# Patient Record
Sex: Female | Born: 1935 | Race: White | Hispanic: No | Smoking: Never smoker
Health system: Southern US, Community
[De-identification: ages and names within clinical notes are randomized; demographics above are authoritative.]

## PROBLEM LIST (undated history)

## (undated) DIAGNOSIS — I10 Essential (primary) hypertension: Secondary | ICD-10-CM

## (undated) HISTORY — DX: Essential (primary) hypertension: I10

## (undated) HISTORY — PX: OTHER SURGICAL HISTORY: SHX169

---

## 2015-02-16 DIAGNOSIS — S72002D Fracture of unspecified part of neck of left femur, subsequent encounter for closed fracture with routine healing: Secondary | ICD-10-CM | POA: Diagnosis not present

## 2015-02-16 DIAGNOSIS — M1990 Unspecified osteoarthritis, unspecified site: Secondary | ICD-10-CM | POA: Diagnosis not present

## 2015-02-16 DIAGNOSIS — S72092A Other fracture of head and neck of left femur, initial encounter for closed fracture: Secondary | ICD-10-CM | POA: Diagnosis not present

## 2015-02-16 DIAGNOSIS — K219 Gastro-esophageal reflux disease without esophagitis: Secondary | ICD-10-CM | POA: Diagnosis not present

## 2015-02-16 DIAGNOSIS — E039 Hypothyroidism, unspecified: Secondary | ICD-10-CM | POA: Diagnosis not present

## 2015-02-16 DIAGNOSIS — S79912A Unspecified injury of left hip, initial encounter: Secondary | ICD-10-CM | POA: Diagnosis not present

## 2015-02-16 DIAGNOSIS — Z96642 Presence of left artificial hip joint: Secondary | ICD-10-CM | POA: Diagnosis not present

## 2015-02-16 DIAGNOSIS — D649 Anemia, unspecified: Secondary | ICD-10-CM | POA: Diagnosis not present

## 2015-02-16 DIAGNOSIS — F419 Anxiety disorder, unspecified: Secondary | ICD-10-CM | POA: Diagnosis not present

## 2015-02-16 DIAGNOSIS — Z22321 Carrier or suspected carrier of Methicillin susceptible Staphylococcus aureus: Secondary | ICD-10-CM | POA: Diagnosis not present

## 2015-02-16 DIAGNOSIS — I38 Endocarditis, valve unspecified: Secondary | ICD-10-CM | POA: Diagnosis not present

## 2015-02-16 DIAGNOSIS — S72012A Unspecified intracapsular fracture of left femur, initial encounter for closed fracture: Secondary | ICD-10-CM | POA: Diagnosis not present

## 2015-02-16 DIAGNOSIS — I2510 Atherosclerotic heart disease of native coronary artery without angina pectoris: Secondary | ICD-10-CM | POA: Diagnosis not present

## 2015-02-16 DIAGNOSIS — R52 Pain, unspecified: Secondary | ICD-10-CM | POA: Diagnosis not present

## 2015-02-16 DIAGNOSIS — M80052A Age-related osteoporosis with current pathological fracture, left femur, initial encounter for fracture: Secondary | ICD-10-CM | POA: Diagnosis not present

## 2015-02-16 DIAGNOSIS — S299XXA Unspecified injury of thorax, initial encounter: Secondary | ICD-10-CM | POA: Diagnosis not present

## 2015-02-16 DIAGNOSIS — S72012D Unspecified intracapsular fracture of left femur, subsequent encounter for closed fracture with routine healing: Secondary | ICD-10-CM | POA: Diagnosis not present

## 2015-02-16 DIAGNOSIS — Z87442 Personal history of urinary calculi: Secondary | ICD-10-CM | POA: Diagnosis not present

## 2015-02-16 DIAGNOSIS — W010XXA Fall on same level from slipping, tripping and stumbling without subsequent striking against object, initial encounter: Secondary | ICD-10-CM | POA: Diagnosis not present

## 2015-02-16 DIAGNOSIS — M25559 Pain in unspecified hip: Secondary | ICD-10-CM | POA: Diagnosis not present

## 2015-02-16 DIAGNOSIS — R230 Cyanosis: Secondary | ICD-10-CM | POA: Diagnosis not present

## 2015-02-16 DIAGNOSIS — F329 Major depressive disorder, single episode, unspecified: Secondary | ICD-10-CM | POA: Diagnosis not present

## 2015-02-16 DIAGNOSIS — M1612 Unilateral primary osteoarthritis, left hip: Secondary | ICD-10-CM | POA: Diagnosis not present

## 2015-02-16 DIAGNOSIS — I952 Hypotension due to drugs: Secondary | ICD-10-CM | POA: Diagnosis not present

## 2015-02-16 DIAGNOSIS — Z471 Aftercare following joint replacement surgery: Secondary | ICD-10-CM | POA: Diagnosis not present

## 2015-02-16 DIAGNOSIS — Z955 Presence of coronary angioplasty implant and graft: Secondary | ICD-10-CM | POA: Diagnosis not present

## 2015-02-16 DIAGNOSIS — J45909 Unspecified asthma, uncomplicated: Secondary | ICD-10-CM | POA: Diagnosis not present

## 2015-02-16 DIAGNOSIS — M810 Age-related osteoporosis without current pathological fracture: Secondary | ICD-10-CM | POA: Diagnosis not present

## 2015-02-16 DIAGNOSIS — R350 Frequency of micturition: Secondary | ICD-10-CM | POA: Diagnosis not present

## 2015-02-16 DIAGNOSIS — Z7982 Long term (current) use of aspirin: Secondary | ICD-10-CM | POA: Diagnosis not present

## 2015-02-16 DIAGNOSIS — S79922A Unspecified injury of left thigh, initial encounter: Secondary | ICD-10-CM | POA: Diagnosis not present

## 2015-02-16 DIAGNOSIS — I10 Essential (primary) hypertension: Secondary | ICD-10-CM | POA: Diagnosis not present

## 2015-02-16 DIAGNOSIS — R600 Localized edema: Secondary | ICD-10-CM | POA: Diagnosis not present

## 2015-02-16 DIAGNOSIS — S72142A Displaced intertrochanteric fracture of left femur, initial encounter for closed fracture: Secondary | ICD-10-CM | POA: Diagnosis not present

## 2015-02-16 DIAGNOSIS — Z8679 Personal history of other diseases of the circulatory system: Secondary | ICD-10-CM | POA: Diagnosis not present

## 2015-02-16 DIAGNOSIS — Z882 Allergy status to sulfonamides status: Secondary | ICD-10-CM | POA: Diagnosis not present

## 2015-02-16 DIAGNOSIS — S72002A Fracture of unspecified part of neck of left femur, initial encounter for closed fracture: Secondary | ICD-10-CM | POA: Diagnosis not present

## 2015-02-16 DIAGNOSIS — Z79899 Other long term (current) drug therapy: Secondary | ICD-10-CM | POA: Diagnosis not present

## 2015-02-16 DIAGNOSIS — M2170 Unequal limb length (acquired), unspecified site: Secondary | ICD-10-CM | POA: Diagnosis not present

## 2015-02-16 DIAGNOSIS — I340 Nonrheumatic mitral (valve) insufficiency: Secondary | ICD-10-CM | POA: Diagnosis not present

## 2015-02-16 DIAGNOSIS — M25552 Pain in left hip: Secondary | ICD-10-CM | POA: Diagnosis not present

## 2015-02-16 DIAGNOSIS — I252 Old myocardial infarction: Secondary | ICD-10-CM | POA: Diagnosis not present

## 2015-02-16 DIAGNOSIS — S72042A Displaced fracture of base of neck of left femur, initial encounter for closed fracture: Secondary | ICD-10-CM | POA: Diagnosis not present

## 2015-02-16 DIAGNOSIS — Z7401 Bed confinement status: Secondary | ICD-10-CM | POA: Diagnosis not present

## 2015-02-16 DIAGNOSIS — I9581 Postprocedural hypotension: Secondary | ICD-10-CM | POA: Diagnosis not present

## 2015-02-21 DIAGNOSIS — I952 Hypotension due to drugs: Secondary | ICD-10-CM | POA: Diagnosis not present

## 2015-02-21 DIAGNOSIS — S72002A Fracture of unspecified part of neck of left femur, initial encounter for closed fracture: Secondary | ICD-10-CM | POA: Diagnosis not present

## 2015-02-21 DIAGNOSIS — D649 Anemia, unspecified: Secondary | ICD-10-CM | POA: Diagnosis not present

## 2015-02-21 DIAGNOSIS — I2510 Atherosclerotic heart disease of native coronary artery without angina pectoris: Secondary | ICD-10-CM | POA: Diagnosis not present

## 2015-02-21 DIAGNOSIS — S72002D Fracture of unspecified part of neck of left femur, subsequent encounter for closed fracture with routine healing: Secondary | ICD-10-CM | POA: Diagnosis not present

## 2015-02-21 DIAGNOSIS — M810 Age-related osteoporosis without current pathological fracture: Secondary | ICD-10-CM | POA: Diagnosis not present

## 2015-02-21 DIAGNOSIS — E039 Hypothyroidism, unspecified: Secondary | ICD-10-CM | POA: Diagnosis not present

## 2015-02-21 DIAGNOSIS — G8918 Other acute postprocedural pain: Secondary | ICD-10-CM | POA: Diagnosis not present

## 2015-02-21 DIAGNOSIS — Z4789 Encounter for other orthopedic aftercare: Secondary | ICD-10-CM | POA: Diagnosis not present

## 2015-02-21 DIAGNOSIS — S72042A Displaced fracture of base of neck of left femur, initial encounter for closed fracture: Secondary | ICD-10-CM | POA: Diagnosis not present

## 2015-02-21 DIAGNOSIS — Z8679 Personal history of other diseases of the circulatory system: Secondary | ICD-10-CM | POA: Diagnosis not present

## 2015-02-21 DIAGNOSIS — M25559 Pain in unspecified hip: Secondary | ICD-10-CM | POA: Diagnosis not present

## 2015-02-21 DIAGNOSIS — M25552 Pain in left hip: Secondary | ICD-10-CM | POA: Diagnosis not present

## 2015-02-21 DIAGNOSIS — Z7401 Bed confinement status: Secondary | ICD-10-CM | POA: Diagnosis not present

## 2015-02-22 DIAGNOSIS — Z4789 Encounter for other orthopedic aftercare: Secondary | ICD-10-CM | POA: Diagnosis not present

## 2015-02-22 DIAGNOSIS — G8918 Other acute postprocedural pain: Secondary | ICD-10-CM | POA: Diagnosis not present

## 2015-02-22 DIAGNOSIS — M810 Age-related osteoporosis without current pathological fracture: Secondary | ICD-10-CM | POA: Diagnosis not present

## 2015-02-22 DIAGNOSIS — D649 Anemia, unspecified: Secondary | ICD-10-CM | POA: Diagnosis not present

## 2015-02-27 DIAGNOSIS — S72042A Displaced fracture of base of neck of left femur, initial encounter for closed fracture: Secondary | ICD-10-CM | POA: Diagnosis not present

## 2015-03-08 DIAGNOSIS — Z471 Aftercare following joint replacement surgery: Secondary | ICD-10-CM | POA: Diagnosis not present

## 2015-03-08 DIAGNOSIS — I10 Essential (primary) hypertension: Secondary | ICD-10-CM | POA: Diagnosis not present

## 2015-03-08 DIAGNOSIS — Z96642 Presence of left artificial hip joint: Secondary | ICD-10-CM | POA: Diagnosis not present

## 2015-03-10 DIAGNOSIS — I10 Essential (primary) hypertension: Secondary | ICD-10-CM | POA: Diagnosis not present

## 2015-03-10 DIAGNOSIS — Z471 Aftercare following joint replacement surgery: Secondary | ICD-10-CM | POA: Diagnosis not present

## 2015-03-10 DIAGNOSIS — Z96642 Presence of left artificial hip joint: Secondary | ICD-10-CM | POA: Diagnosis not present

## 2015-03-11 DIAGNOSIS — Z471 Aftercare following joint replacement surgery: Secondary | ICD-10-CM | POA: Diagnosis not present

## 2015-03-11 DIAGNOSIS — Z96642 Presence of left artificial hip joint: Secondary | ICD-10-CM | POA: Diagnosis not present

## 2015-03-11 DIAGNOSIS — I10 Essential (primary) hypertension: Secondary | ICD-10-CM | POA: Diagnosis not present

## 2015-03-12 DIAGNOSIS — Z96642 Presence of left artificial hip joint: Secondary | ICD-10-CM | POA: Diagnosis not present

## 2015-03-12 DIAGNOSIS — I10 Essential (primary) hypertension: Secondary | ICD-10-CM | POA: Diagnosis not present

## 2015-03-12 DIAGNOSIS — Z471 Aftercare following joint replacement surgery: Secondary | ICD-10-CM | POA: Diagnosis not present

## 2015-03-14 DIAGNOSIS — I10 Essential (primary) hypertension: Secondary | ICD-10-CM | POA: Diagnosis not present

## 2015-03-14 DIAGNOSIS — Z471 Aftercare following joint replacement surgery: Secondary | ICD-10-CM | POA: Diagnosis not present

## 2015-03-14 DIAGNOSIS — Z96642 Presence of left artificial hip joint: Secondary | ICD-10-CM | POA: Diagnosis not present

## 2015-03-18 DIAGNOSIS — Z471 Aftercare following joint replacement surgery: Secondary | ICD-10-CM | POA: Diagnosis not present

## 2015-03-18 DIAGNOSIS — I10 Essential (primary) hypertension: Secondary | ICD-10-CM | POA: Diagnosis not present

## 2015-03-18 DIAGNOSIS — Z96642 Presence of left artificial hip joint: Secondary | ICD-10-CM | POA: Diagnosis not present

## 2015-03-20 DIAGNOSIS — Z471 Aftercare following joint replacement surgery: Secondary | ICD-10-CM | POA: Diagnosis not present

## 2015-03-20 DIAGNOSIS — Z96642 Presence of left artificial hip joint: Secondary | ICD-10-CM | POA: Diagnosis not present

## 2015-03-20 DIAGNOSIS — I10 Essential (primary) hypertension: Secondary | ICD-10-CM | POA: Diagnosis not present

## 2015-03-21 DIAGNOSIS — I10 Essential (primary) hypertension: Secondary | ICD-10-CM | POA: Diagnosis not present

## 2015-03-21 DIAGNOSIS — Z96642 Presence of left artificial hip joint: Secondary | ICD-10-CM | POA: Diagnosis not present

## 2015-03-21 DIAGNOSIS — Z471 Aftercare following joint replacement surgery: Secondary | ICD-10-CM | POA: Diagnosis not present

## 2015-03-25 DIAGNOSIS — I10 Essential (primary) hypertension: Secondary | ICD-10-CM | POA: Diagnosis not present

## 2015-03-25 DIAGNOSIS — Z96642 Presence of left artificial hip joint: Secondary | ICD-10-CM | POA: Diagnosis not present

## 2015-03-25 DIAGNOSIS — Z471 Aftercare following joint replacement surgery: Secondary | ICD-10-CM | POA: Diagnosis not present

## 2015-03-27 DIAGNOSIS — E785 Hyperlipidemia, unspecified: Secondary | ICD-10-CM | POA: Diagnosis not present

## 2015-03-27 DIAGNOSIS — E039 Hypothyroidism, unspecified: Secondary | ICD-10-CM | POA: Diagnosis not present

## 2015-03-27 DIAGNOSIS — I2510 Atherosclerotic heart disease of native coronary artery without angina pectoris: Secondary | ICD-10-CM | POA: Diagnosis not present

## 2015-03-27 DIAGNOSIS — I10 Essential (primary) hypertension: Secondary | ICD-10-CM | POA: Diagnosis not present

## 2015-03-28 DIAGNOSIS — I10 Essential (primary) hypertension: Secondary | ICD-10-CM | POA: Diagnosis not present

## 2015-03-28 DIAGNOSIS — Z96642 Presence of left artificial hip joint: Secondary | ICD-10-CM | POA: Diagnosis not present

## 2015-03-28 DIAGNOSIS — Z471 Aftercare following joint replacement surgery: Secondary | ICD-10-CM | POA: Diagnosis not present

## 2015-04-01 DIAGNOSIS — Z96642 Presence of left artificial hip joint: Secondary | ICD-10-CM | POA: Diagnosis not present

## 2015-04-01 DIAGNOSIS — Z471 Aftercare following joint replacement surgery: Secondary | ICD-10-CM | POA: Diagnosis not present

## 2015-04-01 DIAGNOSIS — I10 Essential (primary) hypertension: Secondary | ICD-10-CM | POA: Diagnosis not present

## 2015-04-04 DIAGNOSIS — Z96642 Presence of left artificial hip joint: Secondary | ICD-10-CM | POA: Diagnosis not present

## 2015-04-04 DIAGNOSIS — I10 Essential (primary) hypertension: Secondary | ICD-10-CM | POA: Diagnosis not present

## 2015-04-04 DIAGNOSIS — Z471 Aftercare following joint replacement surgery: Secondary | ICD-10-CM | POA: Diagnosis not present

## 2015-04-11 DIAGNOSIS — Z96649 Presence of unspecified artificial hip joint: Secondary | ICD-10-CM | POA: Diagnosis not present

## 2015-05-27 DIAGNOSIS — Z96649 Presence of unspecified artificial hip joint: Secondary | ICD-10-CM | POA: Diagnosis not present

## 2015-06-11 DIAGNOSIS — E039 Hypothyroidism, unspecified: Secondary | ICD-10-CM | POA: Diagnosis not present

## 2015-06-11 DIAGNOSIS — Z8781 Personal history of (healed) traumatic fracture: Secondary | ICD-10-CM | POA: Diagnosis not present

## 2015-06-11 DIAGNOSIS — I10 Essential (primary) hypertension: Secondary | ICD-10-CM | POA: Diagnosis not present

## 2015-06-11 DIAGNOSIS — Z79899 Other long term (current) drug therapy: Secondary | ICD-10-CM | POA: Diagnosis not present

## 2015-06-11 DIAGNOSIS — E785 Hyperlipidemia, unspecified: Secondary | ICD-10-CM | POA: Diagnosis not present

## 2015-06-11 DIAGNOSIS — I2510 Atherosclerotic heart disease of native coronary artery without angina pectoris: Secondary | ICD-10-CM | POA: Diagnosis not present

## 2015-08-12 DIAGNOSIS — M810 Age-related osteoporosis without current pathological fracture: Secondary | ICD-10-CM | POA: Diagnosis not present

## 2015-08-12 DIAGNOSIS — Z8781 Personal history of (healed) traumatic fracture: Secondary | ICD-10-CM | POA: Diagnosis not present

## 2015-08-19 DIAGNOSIS — E039 Hypothyroidism, unspecified: Secondary | ICD-10-CM | POA: Diagnosis not present

## 2015-08-28 DIAGNOSIS — Z96649 Presence of unspecified artificial hip joint: Secondary | ICD-10-CM | POA: Diagnosis not present

## 2015-09-02 DIAGNOSIS — M25552 Pain in left hip: Secondary | ICD-10-CM | POA: Diagnosis not present

## 2015-09-02 DIAGNOSIS — Z96649 Presence of unspecified artificial hip joint: Secondary | ICD-10-CM | POA: Diagnosis not present

## 2015-10-10 DIAGNOSIS — Z79899 Other long term (current) drug therapy: Secondary | ICD-10-CM | POA: Diagnosis not present

## 2015-10-10 DIAGNOSIS — Z0000 Encounter for general adult medical examination without abnormal findings: Secondary | ICD-10-CM | POA: Diagnosis not present

## 2015-10-10 DIAGNOSIS — E785 Hyperlipidemia, unspecified: Secondary | ICD-10-CM | POA: Diagnosis not present

## 2015-10-10 DIAGNOSIS — E039 Hypothyroidism, unspecified: Secondary | ICD-10-CM | POA: Diagnosis not present

## 2015-10-10 DIAGNOSIS — I2510 Atherosclerotic heart disease of native coronary artery without angina pectoris: Secondary | ICD-10-CM | POA: Diagnosis not present

## 2015-10-10 DIAGNOSIS — Z1389 Encounter for screening for other disorder: Secondary | ICD-10-CM | POA: Diagnosis not present

## 2015-10-10 DIAGNOSIS — I10 Essential (primary) hypertension: Secondary | ICD-10-CM | POA: Diagnosis not present

## 2016-02-10 DIAGNOSIS — I10 Essential (primary) hypertension: Secondary | ICD-10-CM | POA: Diagnosis not present

## 2016-02-10 DIAGNOSIS — E039 Hypothyroidism, unspecified: Secondary | ICD-10-CM | POA: Diagnosis not present

## 2016-02-10 DIAGNOSIS — Z79899 Other long term (current) drug therapy: Secondary | ICD-10-CM | POA: Diagnosis not present

## 2016-02-10 DIAGNOSIS — I2510 Atherosclerotic heart disease of native coronary artery without angina pectoris: Secondary | ICD-10-CM | POA: Diagnosis not present

## 2016-02-10 DIAGNOSIS — J40 Bronchitis, not specified as acute or chronic: Secondary | ICD-10-CM | POA: Diagnosis not present

## 2016-02-10 DIAGNOSIS — R7301 Impaired fasting glucose: Secondary | ICD-10-CM | POA: Diagnosis not present

## 2016-08-11 DIAGNOSIS — E039 Hypothyroidism, unspecified: Secondary | ICD-10-CM | POA: Diagnosis not present

## 2016-08-11 DIAGNOSIS — R7301 Impaired fasting glucose: Secondary | ICD-10-CM | POA: Diagnosis not present

## 2016-08-11 DIAGNOSIS — M810 Age-related osteoporosis without current pathological fracture: Secondary | ICD-10-CM | POA: Diagnosis not present

## 2016-08-11 DIAGNOSIS — Z79899 Other long term (current) drug therapy: Secondary | ICD-10-CM | POA: Diagnosis not present

## 2016-08-11 DIAGNOSIS — M542 Cervicalgia: Secondary | ICD-10-CM | POA: Diagnosis not present

## 2016-08-11 DIAGNOSIS — E785 Hyperlipidemia, unspecified: Secondary | ICD-10-CM | POA: Diagnosis not present

## 2016-08-11 DIAGNOSIS — M50321 Other cervical disc degeneration at C4-C5 level: Secondary | ICD-10-CM | POA: Diagnosis not present

## 2016-08-11 DIAGNOSIS — I2510 Atherosclerotic heart disease of native coronary artery without angina pectoris: Secondary | ICD-10-CM | POA: Diagnosis not present

## 2016-08-11 DIAGNOSIS — I10 Essential (primary) hypertension: Secondary | ICD-10-CM | POA: Diagnosis not present

## 2016-09-27 DIAGNOSIS — R221 Localized swelling, mass and lump, neck: Secondary | ICD-10-CM | POA: Diagnosis not present

## 2016-09-27 DIAGNOSIS — M542 Cervicalgia: Secondary | ICD-10-CM | POA: Diagnosis not present

## 2016-09-29 DIAGNOSIS — M542 Cervicalgia: Secondary | ICD-10-CM | POA: Diagnosis not present

## 2016-10-14 DIAGNOSIS — M542 Cervicalgia: Secondary | ICD-10-CM | POA: Diagnosis not present

## 2017-02-04 DIAGNOSIS — I2510 Atherosclerotic heart disease of native coronary artery without angina pectoris: Secondary | ICD-10-CM | POA: Diagnosis not present

## 2017-02-04 DIAGNOSIS — Z79899 Other long term (current) drug therapy: Secondary | ICD-10-CM | POA: Diagnosis not present

## 2017-02-04 DIAGNOSIS — E039 Hypothyroidism, unspecified: Secondary | ICD-10-CM | POA: Diagnosis not present

## 2017-02-04 DIAGNOSIS — R7301 Impaired fasting glucose: Secondary | ICD-10-CM | POA: Diagnosis not present

## 2017-02-04 DIAGNOSIS — I10 Essential (primary) hypertension: Secondary | ICD-10-CM | POA: Diagnosis not present

## 2017-02-04 DIAGNOSIS — E559 Vitamin D deficiency, unspecified: Secondary | ICD-10-CM | POA: Diagnosis not present

## 2017-06-10 DIAGNOSIS — I10 Essential (primary) hypertension: Secondary | ICD-10-CM | POA: Diagnosis not present

## 2017-06-10 DIAGNOSIS — Z79899 Other long term (current) drug therapy: Secondary | ICD-10-CM | POA: Diagnosis not present

## 2017-06-10 DIAGNOSIS — E039 Hypothyroidism, unspecified: Secondary | ICD-10-CM | POA: Diagnosis not present

## 2017-06-10 DIAGNOSIS — R7301 Impaired fasting glucose: Secondary | ICD-10-CM | POA: Diagnosis not present

## 2017-07-15 DIAGNOSIS — I10 Essential (primary) hypertension: Secondary | ICD-10-CM | POA: Diagnosis not present

## 2017-08-05 DIAGNOSIS — I10 Essential (primary) hypertension: Secondary | ICD-10-CM | POA: Diagnosis not present

## 2017-08-05 DIAGNOSIS — M7989 Other specified soft tissue disorders: Secondary | ICD-10-CM | POA: Diagnosis not present

## 2017-10-08 DIAGNOSIS — I10 Essential (primary) hypertension: Secondary | ICD-10-CM | POA: Diagnosis not present

## 2018-02-10 DIAGNOSIS — M62838 Other muscle spasm: Secondary | ICD-10-CM | POA: Diagnosis not present

## 2018-02-10 DIAGNOSIS — E785 Hyperlipidemia, unspecified: Secondary | ICD-10-CM | POA: Diagnosis not present

## 2018-02-10 DIAGNOSIS — I2510 Atherosclerotic heart disease of native coronary artery without angina pectoris: Secondary | ICD-10-CM | POA: Diagnosis not present

## 2018-02-10 DIAGNOSIS — I10 Essential (primary) hypertension: Secondary | ICD-10-CM | POA: Diagnosis not present

## 2018-02-10 DIAGNOSIS — E039 Hypothyroidism, unspecified: Secondary | ICD-10-CM | POA: Diagnosis not present

## 2018-02-10 DIAGNOSIS — Z79899 Other long term (current) drug therapy: Secondary | ICD-10-CM | POA: Diagnosis not present

## 2018-07-21 DIAGNOSIS — N390 Urinary tract infection, site not specified: Secondary | ICD-10-CM | POA: Diagnosis not present

## 2018-09-03 DIAGNOSIS — J040 Acute laryngitis: Secondary | ICD-10-CM | POA: Diagnosis not present

## 2018-09-03 DIAGNOSIS — R05 Cough: Secondary | ICD-10-CM | POA: Diagnosis not present

## 2018-09-22 DIAGNOSIS — Z23 Encounter for immunization: Secondary | ICD-10-CM | POA: Diagnosis not present

## 2018-09-22 DIAGNOSIS — Z1339 Encounter for screening examination for other mental health and behavioral disorders: Secondary | ICD-10-CM | POA: Diagnosis not present

## 2018-09-22 DIAGNOSIS — Z7189 Other specified counseling: Secondary | ICD-10-CM | POA: Diagnosis not present

## 2018-09-22 DIAGNOSIS — Z0000 Encounter for general adult medical examination without abnormal findings: Secondary | ICD-10-CM | POA: Diagnosis not present

## 2018-09-22 DIAGNOSIS — Z1331 Encounter for screening for depression: Secondary | ICD-10-CM | POA: Diagnosis not present

## 2018-10-12 DIAGNOSIS — R011 Cardiac murmur, unspecified: Secondary | ICD-10-CM | POA: Diagnosis not present

## 2018-10-12 DIAGNOSIS — I340 Nonrheumatic mitral (valve) insufficiency: Secondary | ICD-10-CM

## 2018-10-12 DIAGNOSIS — I6523 Occlusion and stenosis of bilateral carotid arteries: Secondary | ICD-10-CM | POA: Diagnosis not present

## 2018-10-12 DIAGNOSIS — R0989 Other specified symptoms and signs involving the circulatory and respiratory systems: Secondary | ICD-10-CM | POA: Diagnosis not present

## 2019-03-27 DIAGNOSIS — Z6821 Body mass index (BMI) 21.0-21.9, adult: Secondary | ICD-10-CM | POA: Diagnosis not present

## 2019-03-27 DIAGNOSIS — Z79899 Other long term (current) drug therapy: Secondary | ICD-10-CM | POA: Diagnosis not present

## 2019-03-27 DIAGNOSIS — M810 Age-related osteoporosis without current pathological fracture: Secondary | ICD-10-CM | POA: Diagnosis not present

## 2019-03-27 DIAGNOSIS — I10 Essential (primary) hypertension: Secondary | ICD-10-CM | POA: Diagnosis not present

## 2019-03-27 DIAGNOSIS — E039 Hypothyroidism, unspecified: Secondary | ICD-10-CM | POA: Diagnosis not present

## 2019-03-27 DIAGNOSIS — I2510 Atherosclerotic heart disease of native coronary artery without angina pectoris: Secondary | ICD-10-CM | POA: Diagnosis not present

## 2019-03-27 DIAGNOSIS — E785 Hyperlipidemia, unspecified: Secondary | ICD-10-CM | POA: Diagnosis not present

## 2019-09-28 DIAGNOSIS — E039 Hypothyroidism, unspecified: Secondary | ICD-10-CM | POA: Diagnosis not present

## 2019-09-28 DIAGNOSIS — E785 Hyperlipidemia, unspecified: Secondary | ICD-10-CM | POA: Diagnosis not present

## 2019-09-28 DIAGNOSIS — Z79899 Other long term (current) drug therapy: Secondary | ICD-10-CM | POA: Diagnosis not present

## 2019-09-28 DIAGNOSIS — I7090 Unspecified atherosclerosis: Secondary | ICD-10-CM | POA: Diagnosis not present

## 2019-09-28 DIAGNOSIS — Z6821 Body mass index (BMI) 21.0-21.9, adult: Secondary | ICD-10-CM | POA: Diagnosis not present

## 2019-09-28 DIAGNOSIS — I2510 Atherosclerotic heart disease of native coronary artery without angina pectoris: Secondary | ICD-10-CM | POA: Diagnosis not present

## 2019-09-28 DIAGNOSIS — I10 Essential (primary) hypertension: Secondary | ICD-10-CM | POA: Diagnosis not present

## 2019-11-20 DIAGNOSIS — E039 Hypothyroidism, unspecified: Secondary | ICD-10-CM | POA: Diagnosis not present

## 2020-03-27 DIAGNOSIS — Z7189 Other specified counseling: Secondary | ICD-10-CM | POA: Diagnosis not present

## 2020-03-27 DIAGNOSIS — E785 Hyperlipidemia, unspecified: Secondary | ICD-10-CM | POA: Diagnosis not present

## 2020-03-27 DIAGNOSIS — I10 Essential (primary) hypertension: Secondary | ICD-10-CM | POA: Diagnosis not present

## 2020-03-27 DIAGNOSIS — E039 Hypothyroidism, unspecified: Secondary | ICD-10-CM | POA: Diagnosis not present

## 2020-03-27 DIAGNOSIS — Z79899 Other long term (current) drug therapy: Secondary | ICD-10-CM | POA: Diagnosis not present

## 2020-03-27 DIAGNOSIS — I2510 Atherosclerotic heart disease of native coronary artery without angina pectoris: Secondary | ICD-10-CM | POA: Diagnosis not present

## 2020-09-18 DIAGNOSIS — I10 Essential (primary) hypertension: Secondary | ICD-10-CM | POA: Diagnosis not present

## 2020-09-18 DIAGNOSIS — Z0000 Encounter for general adult medical examination without abnormal findings: Secondary | ICD-10-CM | POA: Diagnosis not present

## 2020-09-18 DIAGNOSIS — Z79899 Other long term (current) drug therapy: Secondary | ICD-10-CM | POA: Diagnosis not present

## 2020-09-18 DIAGNOSIS — M810 Age-related osteoporosis without current pathological fracture: Secondary | ICD-10-CM | POA: Diagnosis not present

## 2020-09-18 DIAGNOSIS — I2510 Atherosclerotic heart disease of native coronary artery without angina pectoris: Secondary | ICD-10-CM | POA: Diagnosis not present

## 2020-09-18 DIAGNOSIS — E785 Hyperlipidemia, unspecified: Secondary | ICD-10-CM | POA: Diagnosis not present

## 2020-09-18 DIAGNOSIS — E039 Hypothyroidism, unspecified: Secondary | ICD-10-CM | POA: Diagnosis not present

## 2021-03-19 DIAGNOSIS — E039 Hypothyroidism, unspecified: Secondary | ICD-10-CM | POA: Diagnosis not present

## 2021-03-19 DIAGNOSIS — Z79899 Other long term (current) drug therapy: Secondary | ICD-10-CM | POA: Diagnosis not present

## 2021-03-19 DIAGNOSIS — R0989 Other specified symptoms and signs involving the circulatory and respiratory systems: Secondary | ICD-10-CM | POA: Diagnosis not present

## 2021-03-19 DIAGNOSIS — R011 Cardiac murmur, unspecified: Secondary | ICD-10-CM | POA: Diagnosis not present

## 2021-03-19 DIAGNOSIS — E785 Hyperlipidemia, unspecified: Secondary | ICD-10-CM | POA: Diagnosis not present

## 2021-03-19 DIAGNOSIS — I10 Essential (primary) hypertension: Secondary | ICD-10-CM | POA: Diagnosis not present

## 2021-03-19 DIAGNOSIS — I2510 Atherosclerotic heart disease of native coronary artery without angina pectoris: Secondary | ICD-10-CM | POA: Diagnosis not present

## 2021-03-27 DIAGNOSIS — I361 Nonrheumatic tricuspid (valve) insufficiency: Secondary | ICD-10-CM | POA: Diagnosis not present

## 2021-03-27 DIAGNOSIS — I340 Nonrheumatic mitral (valve) insufficiency: Secondary | ICD-10-CM | POA: Diagnosis not present

## 2021-03-27 DIAGNOSIS — R0989 Other specified symptoms and signs involving the circulatory and respiratory systems: Secondary | ICD-10-CM | POA: Diagnosis not present

## 2021-03-27 DIAGNOSIS — R011 Cardiac murmur, unspecified: Secondary | ICD-10-CM | POA: Diagnosis not present

## 2021-09-05 DIAGNOSIS — L03211 Cellulitis of face: Secondary | ICD-10-CM | POA: Diagnosis not present

## 2021-09-05 DIAGNOSIS — L0201 Cutaneous abscess of face: Secondary | ICD-10-CM | POA: Diagnosis not present

## 2021-09-05 DIAGNOSIS — Z23 Encounter for immunization: Secondary | ICD-10-CM | POA: Diagnosis not present

## 2021-09-10 DIAGNOSIS — Z79899 Other long term (current) drug therapy: Secondary | ICD-10-CM | POA: Diagnosis not present

## 2021-09-10 DIAGNOSIS — Z681 Body mass index (BMI) 19 or less, adult: Secondary | ICD-10-CM | POA: Diagnosis not present

## 2021-09-10 DIAGNOSIS — Z9181 History of falling: Secondary | ICD-10-CM | POA: Diagnosis not present

## 2021-09-10 DIAGNOSIS — L0201 Cutaneous abscess of face: Secondary | ICD-10-CM | POA: Diagnosis not present

## 2021-09-10 DIAGNOSIS — E039 Hypothyroidism, unspecified: Secondary | ICD-10-CM | POA: Diagnosis not present

## 2021-09-10 DIAGNOSIS — R413 Other amnesia: Secondary | ICD-10-CM | POA: Diagnosis not present

## 2021-09-10 DIAGNOSIS — Z7689 Persons encountering health services in other specified circumstances: Secondary | ICD-10-CM | POA: Diagnosis not present

## 2021-09-18 ENCOUNTER — Encounter: Payer: Self-pay | Admitting: Physician Assistant

## 2021-09-23 ENCOUNTER — Other Ambulatory Visit (INDEPENDENT_AMBULATORY_CARE_PROVIDER_SITE_OTHER): Payer: Medicare Other

## 2021-09-23 ENCOUNTER — Other Ambulatory Visit: Payer: Self-pay

## 2021-09-23 ENCOUNTER — Encounter: Payer: Self-pay | Admitting: Physician Assistant

## 2021-09-23 ENCOUNTER — Ambulatory Visit: Payer: Medicare Other | Admitting: Physician Assistant

## 2021-09-23 VITALS — BP 171/89 | HR 73 | Resp 20 | Ht 63.0 in | Wt 106.0 lb

## 2021-09-23 DIAGNOSIS — R413 Other amnesia: Secondary | ICD-10-CM

## 2021-09-23 DIAGNOSIS — E46 Unspecified protein-calorie malnutrition: Secondary | ICD-10-CM | POA: Diagnosis not present

## 2021-09-23 DIAGNOSIS — F02A0 Dementia in other diseases classified elsewhere, mild, without behavioral disturbance, psychotic disturbance, mood disturbance, and anxiety: Secondary | ICD-10-CM | POA: Insufficient documentation

## 2021-09-23 DIAGNOSIS — G309 Alzheimer's disease, unspecified: Secondary | ICD-10-CM | POA: Diagnosis not present

## 2021-09-23 DIAGNOSIS — E039 Hypothyroidism, unspecified: Secondary | ICD-10-CM | POA: Diagnosis not present

## 2021-09-23 DIAGNOSIS — R634 Abnormal weight loss: Secondary | ICD-10-CM | POA: Diagnosis not present

## 2021-09-23 LAB — VITAMIN B12: Vitamin B-12: 449 pg/mL (ref 211–911)

## 2021-09-23 MED ORDER — DONEPEZIL HCL 10 MG PO TABS: ORAL_TABLET | ORAL | 11 refills | Status: AC

## 2021-09-23 NOTE — Patient Instructions (Addendum)
It was a pleasure to see you today at our office.   Recommendations:  MRI of the brain, the radiology office will call you to arrange you appointment Check labs today We will start donepezil half tablet (5mg ) daily for 2  weeks.  If you are tolerating the medication, then after 2 weeks, we will increase the dose to a full tablet of 10 mg daily.  Side effects include  diarrhea, vivid dreams, and muscle cramps.  Please call the clinic if you experience any of these symptoms.  Please wear your hearing aids  Follow up in 3 months   RECOMMENDATIONS FOR ALL PATIENTS WITH MEMORY PROBLEMS: 1. Continue to exercise (Recommend 30 minutes of walking everyday, or 3 hours every week) 2. Increase social interactions - continue going to [place] and enjoy social gatherings with friends and family 3. Eat healthy, avoid fried foods and eat more fruits and vegetables 4. Maintain adequate blood pressure, blood sugar, and blood cholesterol level. Reducing the risk of stroke and cardiovascular disease also helps promoting better memory. 5. Avoid stressful situations. Live a simple life and avoid aggravations. Organize your time and prepare for the next day in anticipation. 6. Sleep well, avoid any interruptions of sleep and avoid any distractions in the bedroom that may interfere with adequate sleep quality 7. Avoid sugar, avoid sweets as there is a strong link between excessive sugar intake, diabetes, and cognitive impairment We discussed the Mediterranean diet, which has been shown to help patients reduce the risk of progressive memory disorders and reduces cardiovascular risk. This includes eating fish, eat fruits and green leafy vegetables, nuts like almonds and hazelnuts, walnuts, and also use olive oil. Avoid fast foods and fried foods as much as possible. Avoid sweets and sugar as sugar use has been linked to worsening of memory function.  There is always a concern of gradual progression of memory problems. If  this is the case, then we may need to adjust level of care according to patient needs. Support, both to the patient and caregiver, should then be put into place.      You have been referred for a neuropsychological evaluation (i.e., evaluation of memory and thinking abilities). Please bring someone with you to this appointment if possible, as it is helpful for the doctor to hear from both you and another adult who knows you well. Please bring eyeglasses and hearing aids if you wear them.    The evaluation will take approximately 3 hours and has two parts:   The first part is a clinical interview with the neuropsychologist (Dr. Melvyn Novas or Dr. Nicole Kindred). During the interview, the neuropsychologist will speak with you and the individual you brought to the appointment.    The second part of the evaluation is testing with the doctor's technician Hinton Dyer or Maudie Mercury). During the testing, the technician will ask you to remember different types of material, solve problems, and answer some questionnaires. Your family member will not be present for this portion of the evaluation.   Please note: We must reserve several hours of the neuropsychologist's time and the psychometrician's time for your evaluation appointment. As such, there is a No-Show fee of $100. If you are unable to attend any of your appointments, please contact our office as soon as possible to reschedule.    FALL PRECAUTIONS: Be cautious when walking. Scan the area for obstacles that may increase the risk of trips and falls. When getting up in the mornings, sit up at the edge of the  bed for a few minutes before getting out of bed. Consider elevating the bed at the head end to avoid drop of blood pressure when getting up. Walk always in a well-lit room (use night lights in the walls). Avoid area rugs or power cords from appliances in the middle of the walkways. Use a walker or a cane if necessary and consider physical therapy for balance exercise. Get your  eyesight checked regularly.  FINANCIAL OVERSIGHT: Supervision, especially oversight when making financial decisions or transactions is also recommended.  HOME SAFETY: Consider the safety of the kitchen when operating appliances like stoves, microwave oven, and blender. Consider having supervision and share cooking responsibilities until no longer able to participate in those. Accidents with firearms and other hazards in the house should be identified and addressed as well.   ABILITY TO BE LEFT ALONE: If patient is unable to contact 911 operator, consider using LifeLine, or when the need is there, arrange for someone to stay with patients. Smoking is a fire hazard, consider supervision or cessation. Risk of wandering should be assessed by caregiver and if detected at any point, supervision and safe proof recommendations should be instituted.  MEDICATION SUPERVISION: Inability to self-administer medication needs to be constantly addressed. Implement a mechanism to ensure safe administration of the medications.   DRIVING: Regarding driving, in patients with progressive memory problems, driving will be impaired. We advise to have someone else do the driving if trouble finding directions or if minor accidents are reported. Independent driving assessment is available to determine safety of driving.   If you are interested in the driving assessment, you can contact the following:  [employer] in [place]  [address] [place] [phone] or [phone]    [place] refers to food and lifestyle choices that are based on the traditions of countries located on the [employer]. This way of eating has been shown to help prevent certain conditions and improve outcomes for people who have chronic diseases, like kidney disease and heart disease. What are tips  for following this plan? Lifestyle  Cook and eat meals together with your family, when possible. Drink enough fluid to keep your urine clear or pale yellow. Be physically active every day. This includes: Aerobic exercise like running or swimming. Leisure activities like gardening, walking, or housework. Get 7-8 hours of sleep each night. If recommended by your health care provider, drink red wine in moderation. This means 1 glass a day for nonpregnant women and 2 glasses a day for men. A glass of wine equals 5 oz (150 mL). Reading food labels  Check the serving size of packaged foods. For foods such as rice and pasta, the serving size refers to the amount of cooked product, not dry. Check the total fat in packaged foods. Avoid foods that have saturated fat or trans fats. Check the ingredients list for added sugars, such as corn syrup. Shopping  At the grocery store, buy most of your food from the areas near the walls of the store. This includes: Fresh fruits and vegetables (produce). Grains, beans, nuts, and seeds. Some of these may be available in unpackaged forms or large amounts (in bulk). Fresh seafood. Poultry and eggs. Low-fat dairy products. Buy whole ingredients instead of prepackaged foods. Buy fresh fruits and vegetables in-season from local farmers markets. Buy frozen fruits and vegetables in resealable bags. If you do not have access to quality fresh seafood,  buy precooked frozen shrimp or canned fish, such as tuna, salmon, or sardines. Buy small amounts of raw or cooked vegetables, salads, or olives from the deli or salad bar at your store. Stock your pantry so you always have certain foods on hand, such as olive oil, canned tuna, canned tomatoes, rice, pasta, and beans. Cooking  Cook foods with extra-virgin olive oil instead of using butter or other vegetable oils. Have meat as a side dish, and have vegetables or grains as your main dish. This means having meat in small  portions or adding small amounts of meat to foods like pasta or stew. Use beans or vegetables instead of meat in common dishes like chili or lasagna. Experiment with different cooking methods. Try roasting or broiling vegetables instead of steaming or sauteing them. Add frozen vegetables to soups, stews, pasta, or rice. Add nuts or seeds for added healthy fat at each meal. You can add these to yogurt, salads, or vegetable dishes. Marinate fish or vegetables using olive oil, lemon juice, garlic, and fresh herbs. Meal planning  Plan to eat 1 vegetarian meal one day each week. Try to work up to 2 vegetarian meals, if possible. Eat seafood 2 or more times a week. Have healthy snacks readily available, such as: Vegetable sticks with hummus. Greek yogurt. Fruit and nut trail mix. Eat balanced meals throughout the week. This includes: Fruit: 2-3 servings a day Vegetables: 4-5 servings a day Low-fat dairy: 2 servings a day Fish, poultry, or lean meat: 1 serving a day Beans and legumes: 2 or more servings a week Nuts and seeds: 1-2 servings a day Whole grains: 6-8 servings a day Extra-virgin olive oil: 3-4 servings a day Limit red meat and sweets to only a few servings a month What are my food choices? Mediterranean diet Recommended Grains: Whole-grain pasta. Brown rice. Bulgar wheat. Polenta. Couscous. Whole-wheat bread. [place]. Vegetables: Artichokes. Beets. Broccoli. Cabbage. Carrots. Eggplant. Green beans. Chard. Kale. Spinach. Onions. Leeks. Peas. Squash. Tomatoes. Peppers. Radishes. Fruits: Apples. Apricots. Avocado. Berries. Bananas. Cherries. Dates. Figs. Grapes. Lemons. Melon. Oranges. Peaches. Plums. Pomegranate. Meats and other protein foods: Beans. Almonds. Sunflower seeds. Pine nuts. Peanuts. [place]. Salmon. Scallops. Shrimp. [place]. Tilapia. Clams. Oysters. Eggs. Dairy: Low-fat milk. Cheese. Greek yogurt. Beverages: Water. Red wine. Herbal tea. Fats and oils: Extra virgin  olive oil. Avocado oil. Grape seed oil. Sweets and desserts: [place] yogurt with honey. Baked apples. Poached pears. Trail mix. Seasoning and other foods: Basil. Cilantro. Coriander. Cumin. Mint. Parsley. Sage. Rosemary. Tarragon. Garlic. Oregano. Thyme. Pepper. Balsalmic vinegar. Tahini. Hummus. Tomato sauce. Olives. Mushrooms. Limit these Grains: Prepackaged pasta or rice dishes. Prepackaged cereal with added sugar. Vegetables: Deep fried potatoes (french fries). Fruits: Fruit canned in syrup. Meats and other protein foods: Beef. Pork. Lamb. Poultry with skin. Hot dogs. Berniece Salines. Dairy: Ice cream. Sour cream. Whole milk. Beverages: Juice. Sugar-sweetened soft drinks. Beer. Liquor and spirits. Fats and oils: Butter. Canola oil. Vegetable oil. Beef fat (tallow). Lard. Sweets and desserts: Cookies. Cakes. Pies. Candy. Seasoning and other foods: Mayonnaise. Premade sauces and marinades. The items listed may not be a complete list. Talk with your dietitian about what dietary choices are right for you. Summary The Mediterranean diet includes both food and lifestyle choices. Eat a variety of fresh fruits and vegetables, beans, nuts, seeds, and whole grains. Limit the amount of red meat and sweets that you eat. Talk with your health care provider about whether it is safe for you to drink red wine in moderation.  This means 1 glass a day for nonpregnant women and 2 glasses a day for men. A glass of wine equals 5 oz (150 mL). This information is not intended to replace advice given to you by your health care provider. Make sure you discuss any questions you have with your health care provider. Document Released: 06/18/2016 Document Revised: 07/21/2016 Document Reviewed: 06/18/2016 Elsevier Interactive Patient Education  2017 [employer].

## 2021-09-23 NOTE — Progress Notes (Addendum)
Assessment/Plan:   Sarah Cisneros is a 87 y.o. year old female with risk factors including  age, hypertension, hypothyroidism, and  seen today for evaluation of memory loss. 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 Side effects discussed   Discussed safety both in and out of the home.  Discussed the importance of regular daily schedule with inclusion of crossword puzzles to maintain brain function.  Continue to monitor mood with PCP.  Stay active at least 30 minutes at least 3 times a week.  Naps should be scheduled and should be no longer than 60 minutes and should not occur after 2 PM.  Mediterranean diet is recommended  Folllow up in 3 months  Subjective:    The patient is seen in neurologic consultation at the request of [name], F* for the evaluation of memory.  The patient is accompanied by her granddaughter who supplements the history. This is a 87 y.o. year old RH  female who has had memory issues for about 2 years per family report.  The patient used to live in [place], at which time she reports that she "was doing well, I had no problems, I come here to live with my son, and all of a sudden everybody tells me have memory issues ".  Currently she lives in [place] with her son and is trying to live in an apartment where he works as a Animal nutritionist so that he can monitor her.  She insists that "the memory is not as bad ".  Her granddaughter reports that she repeats the same stories and assessing questions,  and at times does not know what she came to the room for, although not frequently.  The memory problems were worse over the last 6 months.  She denies leaving objects in unusual places, except one time where she left her cane in [place] about 1 month ago.  She was driving until recently, without getting lost.  However, her granddaughter needs the car, and has been using it, to what the patient states "I really need my car, she uses it, and I me is going everywhere with it ". Her mood has been slightly irritable due to these issues.  When she is unable to remember she becomes "aggravated ".  She denies any depression or irritability.  She does not enjoy doing crossword puzzles or any other activities such as board games or coloring.  She sleeps fairly well, denies any vivid dreams or sleepwalking, hallucinations or paranoia.  No hygiene concerns, she is independent of bathing and dressing.  Her medications are monitored by her son.  Of note, she was on no medications when she came to [facility].  Her son does the finances.  Her appetite is good, denies trouble swallowing.  She cooks not as often, and only one time she forgot that she had of the stove.  No safety concerns at this time.  She ambulates with a cane on the right, denies any head injuries.  She had a fall, mechanical, about 2 months ago, without any  sequela.  She denies any headaches, double vision, dizziness, focal numbness or tingling, unilateral weakness or tremors or anosmia.  No history of seizures.  She denies urine incontinence, admits to not drinking enough water, she has UTIs about 2 times a year, denies constipation or diarrhea.  No history of sleep apnea, alcohol or tobacco.  Family history negative for dementia.      Allergies  Allergen Reactions   Sulfa Antibiotics     Rash and swelling    Current Outpatient Medications  Medication Instructions   donepezil (ARICEPT) 10 MG tablet Take half tablet (5 mg) daily for 2 weeks, then  increase to the full tablet at 10 mg daily   levothyroxine (SYNTHROID) 75 mcg, Oral, Every morning   lisinopril-hydrochlorothiazide (ZESTORETIC) 10-12.5 MG tablet 1 tablet, Oral, Daily   mupirocin ointment (BACTROBAN) 2 % Topical, 2 times daily     VITALS:   Vitals:   09/23/21 1335  BP: (!) 171/89  Pulse: 73  Resp: 20  SpO2: 96%  Weight: 106 lb (48.1 kg)  Height: 5\' 3"  (1.6 m)   No flowsheet data found.  PHYSICAL EXAM   HEENT:  Normocephalic, atraumatic. The mucous membranes are moist. The superficial temporal arteries are without ropiness or tenderness. Cardiovascular: Regular rate and rhythm. Lungs: Clear to auscultation bilaterally. Neck: There are no carotid bruits noted bilaterally.  NEUROLOGICAL: Montreal Cognitive Assessment  09/23/2021  Visuospatial/ Executive (0/5) 2  Naming (0/3) 2  Attention: Read list of digits (0/2) 1  Attention: Read list of letters (0/1) 1  Attention: Serial 7 subtraction starting at 100 (0/3) 1  Language: Repeat phrase (0/2) 0  Language : Fluency (0/1) 0  Abstraction (0/2) 0  Delayed Recall (0/5) 0  Orientation (0/6) 6  Total 13  Adjusted Score (based on education) 14   No flowsheet data found.  No flowsheet data found.   Orientation:  Alert and oriented to person, place and time. No aphasia or dysarthria. Fund of knowledge is appropriate. Recent memory impaired and remote memory intact.  Attention and concentration are normal.  Able to name objects and repeat phrases 2/3 . Delayed recall  0/5 Cranial nerves: There is good facial symmetry. Extraocular muscles are intact and visual fields are full to confrontational testing. Speech is fluent and clear. Soft palate rises symmetrically and there is no tongue deviation. Hearing is intact to conversational tone. Tone: Tone is good throughout. Sensation: Sensation is intact to light touch and pinprick throughout. Vibration is intact at the bilateral big toe.There is no extinction with double  simultaneous stimulation. There is no sensory dermatomal level identified. Coordination: The patient has no difficulty with RAM's or FNF bilaterally. Normal finger to nose  Motor: Strength is 5/5 in the bilateral upper and lower extremities. There is no pronator drift. There are no fasciculations noted. DTR's: Deep tendon reflexes are 2/4 at the bilateral biceps, triceps, brachioradialis, patella and achilles.  Plantar responses are downgoing bilaterally. Gait and Station: The patient is able to ambulate without difficulty.The patient is able to heel toe walk without any difficulty.The patient is able to ambulate in a tandem fashion. The patient is able to stand in the Romberg position.     Thank you for allowing [place] the opportunity to participate in the care of this nice patient. Please do not hesitate to contact us for any questions or concerns.   Total time spent on today's visit was 60 minutes, including both face-to-face time and nonface-to-face time.  Time included that spent  on review of records (prior notes available to me/labs/imaging if pertinent), discussing treatment and goals, answering patient's questions and coordinating care.  Cc:  Phys[facility], [place] 09/23/2021 2:46 PM

## 2021-09-24 ENCOUNTER — Telehealth: Payer: Self-pay | Admitting: Physician Assistant

## 2021-09-24 NOTE — Telephone Encounter (Signed)
Patient advised.

## 2021-09-24 NOTE — Telephone Encounter (Signed)
Pt's granddaughter called in after getting a missed call from [place].

## 2021-09-24 NOTE — Telephone Encounter (Signed)
Voicemail, labs are normal

## 2021-09-29 ENCOUNTER — Other Ambulatory Visit: Payer: Self-pay | Admitting: Physician Assistant

## 2021-09-29 DIAGNOSIS — R413 Other amnesia: Secondary | ICD-10-CM

## 2021-10-16 ENCOUNTER — Ambulatory Visit
Admission: RE | Admit: 2021-10-16 | Discharge: 2021-10-16 | Disposition: A | Discharge status: Home / Self Care | Payer: Medicare Other | Source: Ambulatory Visit | Attending: Physician Assistant | Admitting: Physician Assistant

## 2021-10-16 ENCOUNTER — Telehealth: Payer: Self-pay

## 2021-10-16 ENCOUNTER — Other Ambulatory Visit: Payer: Self-pay

## 2021-10-16 DIAGNOSIS — I614 Nontraumatic intracerebral hemorrhage in cerebellum: Secondary | ICD-10-CM | POA: Diagnosis not present

## 2021-10-16 DIAGNOSIS — R413 Other amnesia: Secondary | ICD-10-CM

## 2021-10-16 DIAGNOSIS — I6782 Cerebral ischemia: Secondary | ICD-10-CM | POA: Diagnosis not present

## 2021-10-16 DIAGNOSIS — G319 Degenerative disease of nervous system, unspecified: Secondary | ICD-10-CM | POA: Diagnosis not present

## 2021-10-16 NOTE — Telephone Encounter (Signed)
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 MR or CT angiography of the head is recommended to better assess for a possible aneurysm at this site.

## 2021-10-17 ENCOUNTER — Other Ambulatory Visit: Payer: Self-pay

## 2021-10-17 DIAGNOSIS — R413 Other amnesia: Secondary | ICD-10-CM

## 2021-10-17 DIAGNOSIS — R9389 Abnormal findings on diagnostic imaging of other specified body structures: Secondary | ICD-10-CM

## 2021-10-17 NOTE — Telephone Encounter (Signed)
MRA of the head ordered in Epic

## 2021-10-22 DIAGNOSIS — Z23 Encounter for immunization: Secondary | ICD-10-CM | POA: Diagnosis not present

## 2021-10-22 DIAGNOSIS — S01112A Laceration without foreign body of left eyelid and periocular area, initial encounter: Secondary | ICD-10-CM | POA: Diagnosis not present

## 2021-10-22 DIAGNOSIS — S0181XA Laceration without foreign body of other part of head, initial encounter: Secondary | ICD-10-CM | POA: Diagnosis not present

## 2021-10-27 ENCOUNTER — Other Ambulatory Visit: Payer: Self-pay

## 2021-10-27 ENCOUNTER — Ambulatory Visit
Admission: RE | Admit: 2021-10-27 | Discharge: 2021-10-27 | Disposition: A | Discharge status: Home / Self Care | Payer: Medicare Other | Source: Ambulatory Visit | Attending: Physician Assistant | Admitting: Physician Assistant

## 2021-10-27 DIAGNOSIS — R413 Other amnesia: Secondary | ICD-10-CM

## 2021-10-27 DIAGNOSIS — I720 Aneurysm of carotid artery: Secondary | ICD-10-CM | POA: Diagnosis not present

## 2021-10-27 DIAGNOSIS — R9389 Abnormal findings on diagnostic imaging of other specified body structures: Secondary | ICD-10-CM

## 2021-10-29 ENCOUNTER — Telehealth: Payer: Self-pay | Admitting: Physician Assistant

## 2021-10-29 NOTE — Telephone Encounter (Signed)
Error

## 2021-11-05 DIAGNOSIS — J479 Bronchiectasis, uncomplicated: Secondary | ICD-10-CM | POA: Diagnosis not present

## 2021-11-05 DIAGNOSIS — R531 Weakness: Secondary | ICD-10-CM | POA: Diagnosis not present

## 2021-11-05 DIAGNOSIS — R519 Headache, unspecified: Secondary | ICD-10-CM | POA: Diagnosis not present

## 2021-11-05 DIAGNOSIS — Z20822 Contact with and (suspected) exposure to covid-19: Secondary | ICD-10-CM | POA: Diagnosis not present

## 2021-11-05 DIAGNOSIS — J209 Acute bronchitis, unspecified: Secondary | ICD-10-CM | POA: Diagnosis not present

## 2021-11-05 DIAGNOSIS — J439 Emphysema, unspecified: Secondary | ICD-10-CM | POA: Diagnosis not present

## 2021-11-05 DIAGNOSIS — J029 Acute pharyngitis, unspecified: Secondary | ICD-10-CM | POA: Diagnosis not present

## 2021-11-05 DIAGNOSIS — R509 Fever, unspecified: Secondary | ICD-10-CM | POA: Diagnosis not present

## 2021-11-05 DIAGNOSIS — R059 Cough, unspecified: Secondary | ICD-10-CM | POA: Diagnosis not present

## 2021-11-07 ENCOUNTER — Other Ambulatory Visit: Payer: Self-pay

## 2021-11-07 DIAGNOSIS — I729 Aneurysm of unspecified site: Secondary | ICD-10-CM

## 2021-12-24 ENCOUNTER — Ambulatory Visit: Payer: Medicare Other | Admitting: Physician Assistant

## 2021-12-24 NOTE — Progress Notes (Unsigned)
Assessment/Plan:      Recommendations:  Discussed safety both in and out of the home.  Discussed the importance of regular daily schedule with inclusion of crossword puzzles to maintain brain function.  Continue to monitor mood by PCP Stay active at least 30 minutes at least 3 times a week.  Naps should be scheduled and should be no longer than 60 minutes and should not occur after 2 PM.  Mediterranean diet is recommended  Control cardiovascular risk factors  Continue donepezil 10 mg daily Side effects were discussed Follow up in   months.   Case discussed with Dr. Delice Lesch who agrees with the plan   88 y.o. year old female with risk factors including  age, hypertension, hypothyroidism, and  seen today for evaluation of memory loss. 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    Subjective:    Sarah Cisneros is a very pleasant 88 y.o. RH female  seen today in follow up for memory loss. This patient is accompanied in the office by her who supplements the history.  Previous records as well as any outside records available were reviewed prior to todays visit.  Patient was last seen at our office on  at which time her  Patient is currently on     Currently she lives in [place] with her son and is trying to live in an apartment where he works as a Animal nutritionist so that he can monitor her.  She insists that "the memory is not as bad ".  Her granddaughter reports that she repeats the same stories and assessing questions, and at times does not know what she came to the room for, although not frequently.    She denies leaving objects in unusual places, except one time where she left her cane in [place] about 1 month ago.  She was driving until recently, without getting lost.  However, her granddaughter needs the car, and has been using it, to what the patient states "I really need my car, she uses it, and I me is going everywhere with it ". Her  mood has been slightly irritable due to these issues.   When she is unable to remember she becomes "aggravated ".   She denies any depression.  She does not enjoy doing crossword puzzles or any other activities such as board games or coloring.   She sleeps fairly well, denies any vivid dreams or sleepwalking, hallucinations or paranoia.   No hygiene concerns, she is independent of bathing and dressing.   Her medications are monitored by her son.  Of note, she was on no medications when she came to [facility].   Her son does the finances.   Her appetite is good, denies trouble swallowing.  She cooks not as often, and only one time she forgot that she had of the stove.   No safety concerns at this time.   She ambulates with a cane on the right, denies any head injuries.  She had a fall, mechanical, about 2 months ago, without any sequela.   She denies any headaches, double vision, dizziness, focal numbness or tingling, unilateral weakness or tremors or anosmia .  No history of seizures.   She denies urine incontinence, admits to not drinking enough water, she has UTIs about 2 times a year, denies constipation or diarrhea.      Initial visit 11/15/22The patient is seen in neurologic consultation at the request of [name], F* for the  evaluation of memory.  The patient is accompanied by her granddaughter who supplements the history. This is a 88 y.o. year old RH  female who has had memory issues for about 2 years per family report.  The patient used to live in [place], at which time she reports that she "was doing well, I had no problems, I come here to live with my son, and all of a sudden everybody tells me have memory issues ".  Currently she lives in [place] with her son and is trying to live in an apartment where he works as a Animal nutritionist so that he can monitor her.  She insists that "the memory is not as bad ".  Her granddaughter reports that she repeats the same stories and  assessing questions, and at times does not know what she came to the room for, although not frequently.  The memory problems were worse over the last 6 months.  She denies leaving objects in unusual places, except one time where she left her cane in [place] about 1 month ago.  She was driving until recently, without getting lost.  However, her granddaughter needs the car, and has been using it, to what the patient states "I really need my car, she uses it, and I me is going everywhere with it ". Her mood has been slightly irritable due to these issues.  When she is unable to remember she becomes "aggravated ".  She denies any depression or irritability.  She does not enjoy doing crossword puzzles or any other activities such as board games or coloring.  She sleeps fairly well, denies any vivid dreams or sleepwalking, hallucinations or paranoia.  No hygiene concerns, she is independent of bathing and dressing.  Her medications are monitored by her son.  Of note, she was on no medications when she came to [facility].  Her son does the finances.  Her appetite is good, denies trouble swallowing.  She cooks not as often, and only one time she forgot that she had of the stove.  No safety concerns at this time.  She ambulates with a cane on the right, denies any head injuries.  She had a fall, mechanical, about 2 months ago, without any sequela.  She denies any headaches, double vision, dizziness, focal numbness or tingling, unilateral weakness or tremors or anosmia.  No history of seizures.  She denies urine incontinence, admits to not drinking enough water, she has UTIs about 2 times a year, denies constipation or diarrhea.  No history of sleep apnea, alcohol or tobacco.  Family history negative for dementia.      MRI brain wo contrast 10/16/21  No evidence of acute intracranial abnormality.  Severe chronic small vessel ischemic changes within the cerebral white matter.  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  Mild-for-age generalized cerebral and cerebellar atrophy.  Bulbous appearance of the cavernous left ICA. MR or CT angiography of the head is recommended to better assess for a possible aneurysm at this site.    MRA head wo contrast 10/27/21 1. 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 2. 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 3. Intracranial atherosclerotic disease, most pronounced about the carotid siphons where there is associated 50% stenosis about the cavernous right ICA. No  other proximal high-grade or correctable stenosis about the intracranial circulation.     PREVIOUS MEDICATIONS:   CURRENT MEDICATIONS:  Outpatient Encounter Medications as of 12/24/2021  Medication Sig   donepezil (ARICEPT) 10 MG tablet Take half tablet (5 mg) daily for 2 weeks, then increase to the full tablet at 10 mg daily   levothyroxine (SYNTHROID) 75 MCG tablet Take 75 mcg by mouth every morning.   lisinopril-hydrochlorothiazide (ZESTORETIC) 10-12.5 MG tablet Take 1 tablet by mouth daily.   mupirocin ointment (BACTROBAN) 2 % Apply topically 2 (two) times daily.   No facility-administered encounter medications on file as of 12/24/2021.     Objective:     PHYSICAL EXAMINATION:    VITALS:  There were no vitals filed for this visit.  GEN:  The patient appears stated age and is in NAD. HEENT:  Normocephalic, atraumatic.   Neurological examination:  General: NAD, well-groomed, appears stated age. Orientation: The patient is alert. Oriented to person, place and date Cranial nerves: There is good facial symmetry.The speech is fluent and clear. No aphasia or dysarthria. Fund of knowledge is  appropriate. Recent and remote memory are impaired. Attention and concentration are reduced.  Able to name objects and repeat phrases.  Hearing is intact to conversational tone.    Sensation: Sensation is intact to light touch throughout Motor: Strength is at least antigravity x4. Tremors: none  DTR's 2/4 in [facility] Cognitive Assessment  09/23/2021  Visuospatial/ Executive (0/5) 2  Naming (0/3) 2  Attention: Read list of digits (0/2) 1  Attention: Read list of letters (0/1) 1  Attention: Serial 7 subtraction starting at 100 (0/3) 1  Language: Repeat phrase (0/2) 0  Language : Fluency (0/1) 0  Abstraction (0/2) 0  Delayed Recall (0/5) 0  Orientation (0/6) 6  Total 13  Adjusted Score (based on education) 14   No flowsheet data found.  No flowsheet data found.     Movement examination: Tone: There is normal tone in the UE/LE Abnormal movements:  no tremor.  No myoclonus.  No asterixis.   Coordination:  There is no decremation with RAM's. Normal finger to nose  Gait and Station: The patient has no difficulty arising out of a deep-seated chair without the use of the hands. The patient's stride length is good.  Gait is cautious and narrow.        Total time spent on today's visit was minutes, including both face-to-face time and nonface-to-face time. Time included that spent on review of records (prior notes available to me/labs/imaging if pertinent), discussing treatment and goals, answering patient's questions and coordinating care.  Cc:  Physicians, [facility], [place]

## 2022-04-20 DIAGNOSIS — D2330 Other benign neoplasm of skin of unspecified part of face: Secondary | ICD-10-CM | POA: Diagnosis not present

## 2022-04-20 DIAGNOSIS — S1096XA Insect bite of unspecified part of neck, initial encounter: Secondary | ICD-10-CM | POA: Diagnosis not present

## 2022-05-21 DIAGNOSIS — L821 Other seborrheic keratosis: Secondary | ICD-10-CM | POA: Diagnosis not present

## 2022-05-21 DIAGNOSIS — D485 Neoplasm of uncertain behavior of skin: Secondary | ICD-10-CM | POA: Diagnosis not present

## 2022-06-04 DIAGNOSIS — L739 Follicular disorder, unspecified: Secondary | ICD-10-CM | POA: Diagnosis not present

## 2022-06-04 DIAGNOSIS — D485 Neoplasm of uncertain behavior of skin: Secondary | ICD-10-CM | POA: Diagnosis not present

## 2022-07-16 DIAGNOSIS — Z681 Body mass index (BMI) 19 or less, adult: Secondary | ICD-10-CM | POA: Diagnosis not present

## 2022-07-16 DIAGNOSIS — L989 Disorder of the skin and subcutaneous tissue, unspecified: Secondary | ICD-10-CM | POA: Diagnosis not present

## 2022-07-16 DIAGNOSIS — M159 Polyosteoarthritis, unspecified: Secondary | ICD-10-CM | POA: Diagnosis not present

## 2022-08-11 DIAGNOSIS — I10 Essential (primary) hypertension: Secondary | ICD-10-CM | POA: Diagnosis not present

## 2022-08-11 DIAGNOSIS — R0989 Other specified symptoms and signs involving the circulatory and respiratory systems: Secondary | ICD-10-CM | POA: Diagnosis not present

## 2022-08-11 DIAGNOSIS — Z0000 Encounter for general adult medical examination without abnormal findings: Secondary | ICD-10-CM | POA: Diagnosis not present

## 2022-08-11 DIAGNOSIS — E039 Hypothyroidism, unspecified: Secondary | ICD-10-CM | POA: Diagnosis not present

## 2022-08-11 DIAGNOSIS — E559 Vitamin D deficiency, unspecified: Secondary | ICD-10-CM | POA: Diagnosis not present

## 2022-08-11 DIAGNOSIS — L989 Disorder of the skin and subcutaneous tissue, unspecified: Secondary | ICD-10-CM | POA: Diagnosis not present

## 2022-08-11 DIAGNOSIS — M810 Age-related osteoporosis without current pathological fracture: Secondary | ICD-10-CM | POA: Diagnosis not present

## 2022-08-11 DIAGNOSIS — R7301 Impaired fasting glucose: Secondary | ICD-10-CM | POA: Diagnosis not present

## 2022-08-11 DIAGNOSIS — Z79899 Other long term (current) drug therapy: Secondary | ICD-10-CM | POA: Diagnosis not present

## 2022-08-11 DIAGNOSIS — E785 Hyperlipidemia, unspecified: Secondary | ICD-10-CM | POA: Diagnosis not present

## 2022-08-11 DIAGNOSIS — L853 Xerosis cutis: Secondary | ICD-10-CM | POA: Diagnosis not present

## 2022-09-17 DIAGNOSIS — I10 Essential (primary) hypertension: Secondary | ICD-10-CM | POA: Diagnosis not present

## 2022-09-17 DIAGNOSIS — M7989 Other specified soft tissue disorders: Secondary | ICD-10-CM | POA: Diagnosis not present

## 2022-09-17 DIAGNOSIS — R600 Localized edema: Secondary | ICD-10-CM | POA: Diagnosis not present

## 2022-09-17 DIAGNOSIS — I639 Cerebral infarction, unspecified: Secondary | ICD-10-CM | POA: Diagnosis not present

## 2023-02-21 DIAGNOSIS — E039 Hypothyroidism, unspecified: Secondary | ICD-10-CM | POA: Diagnosis not present

## 2023-02-21 DIAGNOSIS — Z720 Tobacco use: Secondary | ICD-10-CM | POA: Diagnosis not present

## 2023-02-21 DIAGNOSIS — R32 Unspecified urinary incontinence: Secondary | ICD-10-CM | POA: Diagnosis not present

## 2023-02-21 DIAGNOSIS — I739 Peripheral vascular disease, unspecified: Secondary | ICD-10-CM | POA: Diagnosis not present

## 2023-02-21 DIAGNOSIS — E46 Unspecified protein-calorie malnutrition: Secondary | ICD-10-CM | POA: Diagnosis not present

## 2023-02-21 DIAGNOSIS — Z008 Encounter for other general examination: Secondary | ICD-10-CM | POA: Diagnosis not present

## 2023-02-21 DIAGNOSIS — Z681 Body mass index (BMI) 19 or less, adult: Secondary | ICD-10-CM | POA: Diagnosis not present

## 2023-02-21 DIAGNOSIS — I10 Essential (primary) hypertension: Secondary | ICD-10-CM | POA: Diagnosis not present

## 2023-02-21 DIAGNOSIS — R600 Localized edema: Secondary | ICD-10-CM | POA: Diagnosis not present

## 2023-02-21 DIAGNOSIS — Z8249 Family history of ischemic heart disease and other diseases of the circulatory system: Secondary | ICD-10-CM | POA: Diagnosis not present

## 2023-02-21 DIAGNOSIS — I252 Old myocardial infarction: Secondary | ICD-10-CM | POA: Diagnosis not present

## 2023-02-21 DIAGNOSIS — Z5982 Transportation insecurity: Secondary | ICD-10-CM | POA: Diagnosis not present

## 2023-02-22 DIAGNOSIS — E785 Hyperlipidemia, unspecified: Secondary | ICD-10-CM | POA: Diagnosis not present

## 2023-02-22 DIAGNOSIS — I2510 Atherosclerotic heart disease of native coronary artery without angina pectoris: Secondary | ICD-10-CM | POA: Diagnosis not present

## 2023-02-22 DIAGNOSIS — Z79899 Other long term (current) drug therapy: Secondary | ICD-10-CM | POA: Diagnosis not present

## 2023-02-22 DIAGNOSIS — E039 Hypothyroidism, unspecified: Secondary | ICD-10-CM | POA: Diagnosis not present

## 2023-02-22 DIAGNOSIS — L989 Disorder of the skin and subcutaneous tissue, unspecified: Secondary | ICD-10-CM | POA: Diagnosis not present

## 2023-02-22 DIAGNOSIS — I10 Essential (primary) hypertension: Secondary | ICD-10-CM | POA: Diagnosis not present

## 2023-03-30 DIAGNOSIS — H18509 Unspecified hereditary corneal dystrophies, unspecified eye: Secondary | ICD-10-CM | POA: Diagnosis not present

## 2023-03-30 DIAGNOSIS — H25813 Combined forms of age-related cataract, bilateral: Secondary | ICD-10-CM | POA: Diagnosis not present

## 2023-04-14 DIAGNOSIS — H40013 Open angle with borderline findings, low risk, bilateral: Secondary | ICD-10-CM | POA: Diagnosis not present

## 2023-04-14 DIAGNOSIS — H25812 Combined forms of age-related cataract, left eye: Secondary | ICD-10-CM | POA: Diagnosis not present

## 2023-04-14 DIAGNOSIS — H25811 Combined forms of age-related cataract, right eye: Secondary | ICD-10-CM | POA: Diagnosis not present

## 2023-04-14 DIAGNOSIS — H40052 Ocular hypertension, left eye: Secondary | ICD-10-CM | POA: Diagnosis not present

## 2023-07-27 DIAGNOSIS — R509 Fever, unspecified: Secondary | ICD-10-CM | POA: Diagnosis not present

## 2023-07-27 DIAGNOSIS — I771 Stricture of artery: Secondary | ICD-10-CM | POA: Diagnosis not present

## 2023-07-27 DIAGNOSIS — R059 Cough, unspecified: Secondary | ICD-10-CM | POA: Diagnosis not present

## 2023-07-27 DIAGNOSIS — I10 Essential (primary) hypertension: Secondary | ICD-10-CM | POA: Diagnosis not present

## 2023-07-27 DIAGNOSIS — Z20822 Contact with and (suspected) exposure to covid-19: Secondary | ICD-10-CM | POA: Diagnosis not present

## 2023-07-27 DIAGNOSIS — I700 Atherosclerosis of aorta: Secondary | ICD-10-CM | POA: Diagnosis not present

## 2023-07-27 DIAGNOSIS — U071 COVID-19: Secondary | ICD-10-CM | POA: Diagnosis not present

## 2023-09-27 IMAGING — MR MR MRA HEAD W/O CM
1 series · 10 of 48 positions shown · non-contrast
Comparison: Previous brain MRI from 10/16/2021.

CLINICAL DATA: Initial evaluation for possible aneurysm, questioned
on prior MRI.

EXAM:
MRA HEAD WITHOUT CONTRAST
TECHNIQUE: Angiographic images of the Circle of Willis were acquired using MRA
technique without intravenous contrast.

[Series 8: [id number] · axial · 0.6mm · 0.26mm/px · z∈[-61,+2]mm · 10 of 149 slices shown]
[im 10/149]
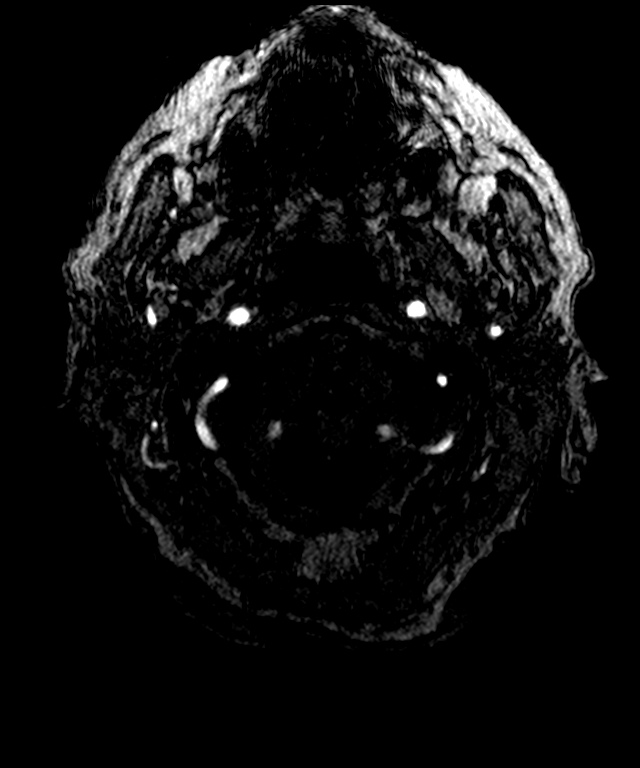
[im 26/149]
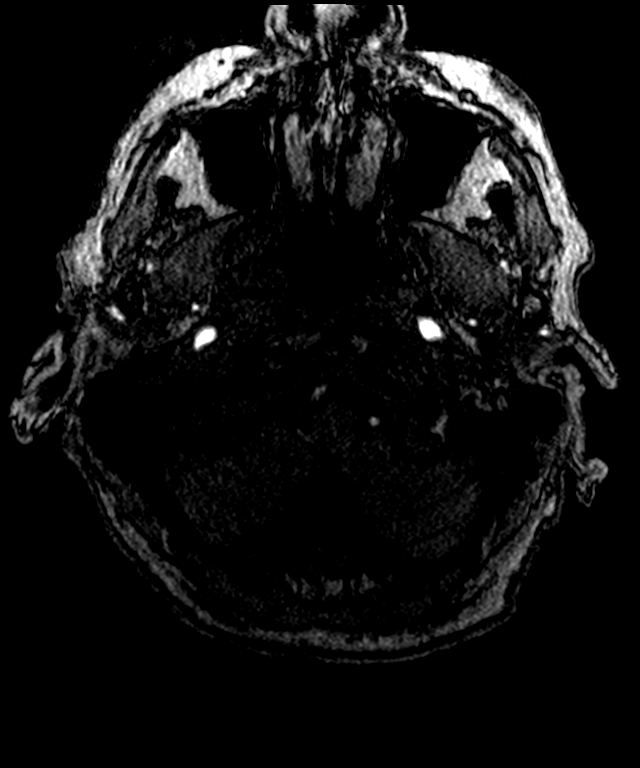
[im 29/149]
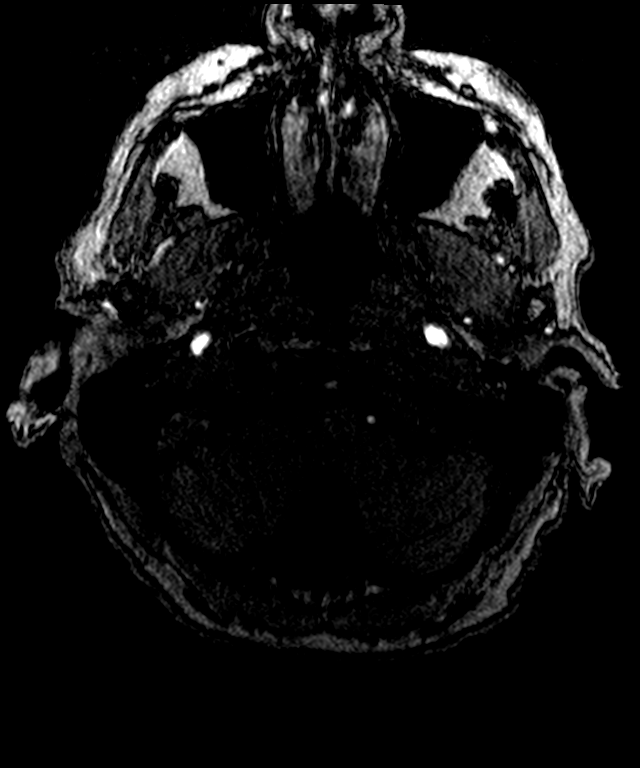
[im 48/149]
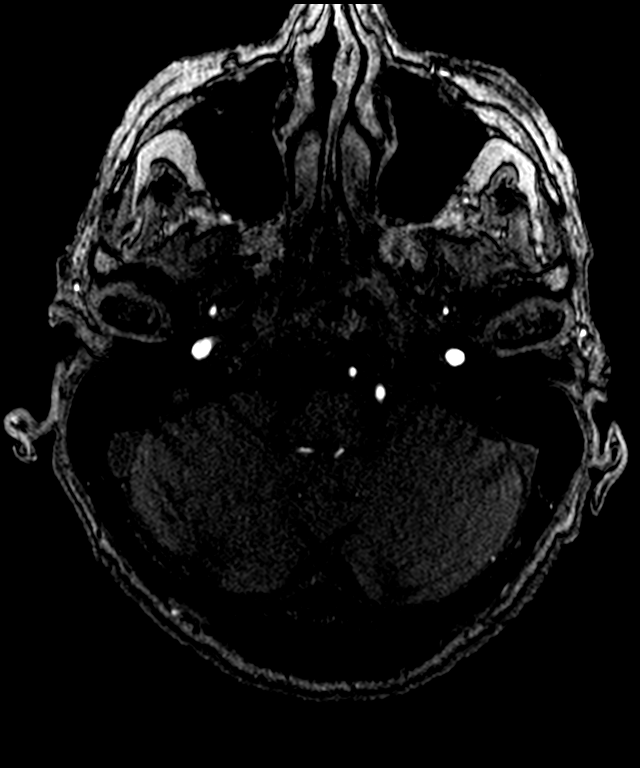
[im 67/149]
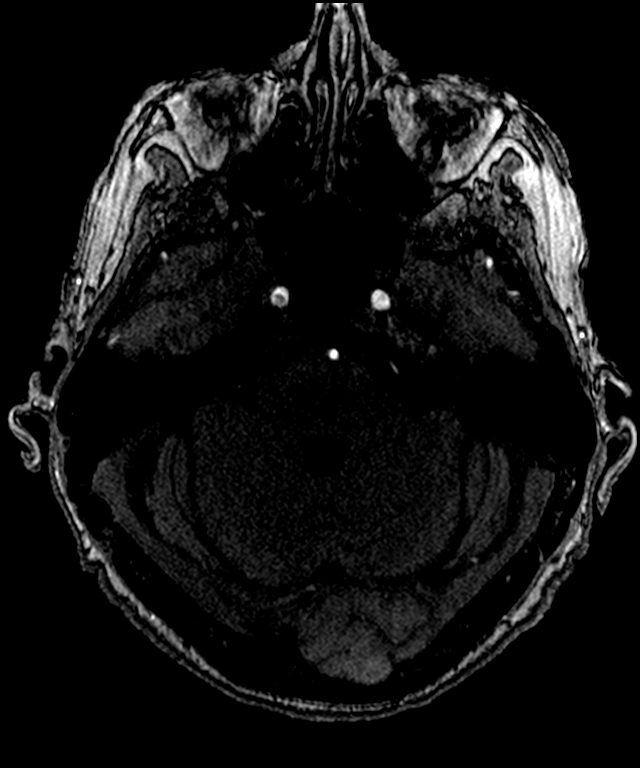
[im 76/149]
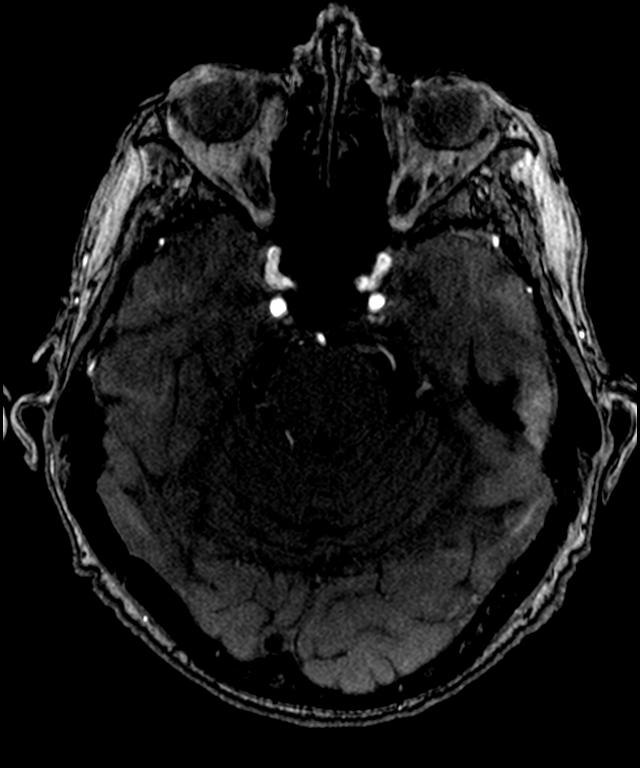
[im 86/149]
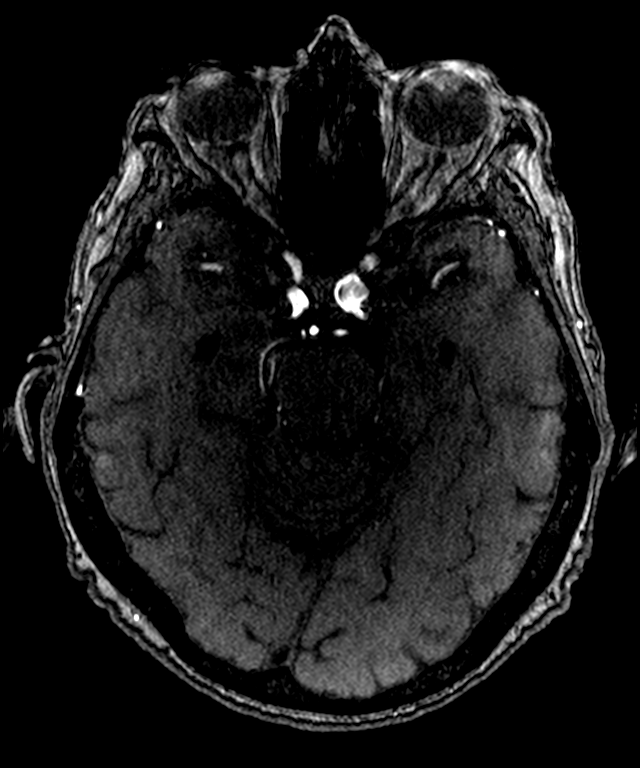
[im 104/149]
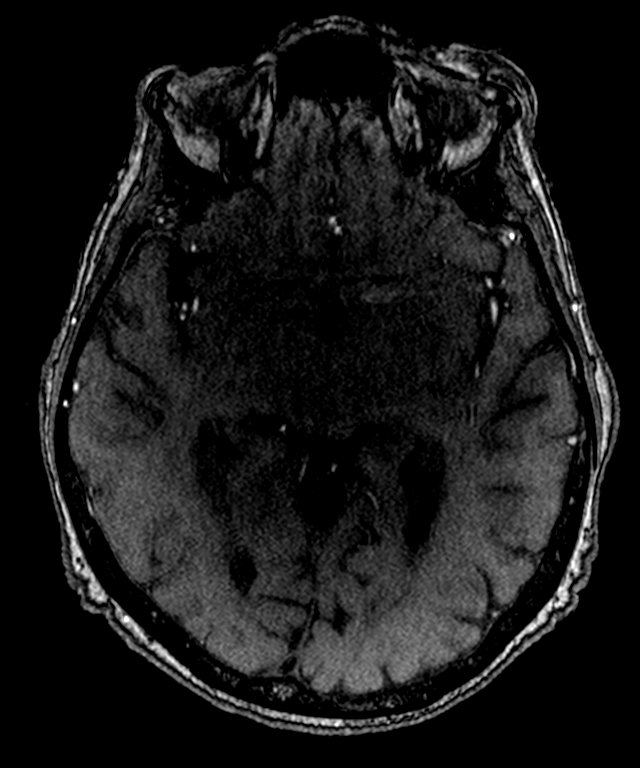
[im 123/149]
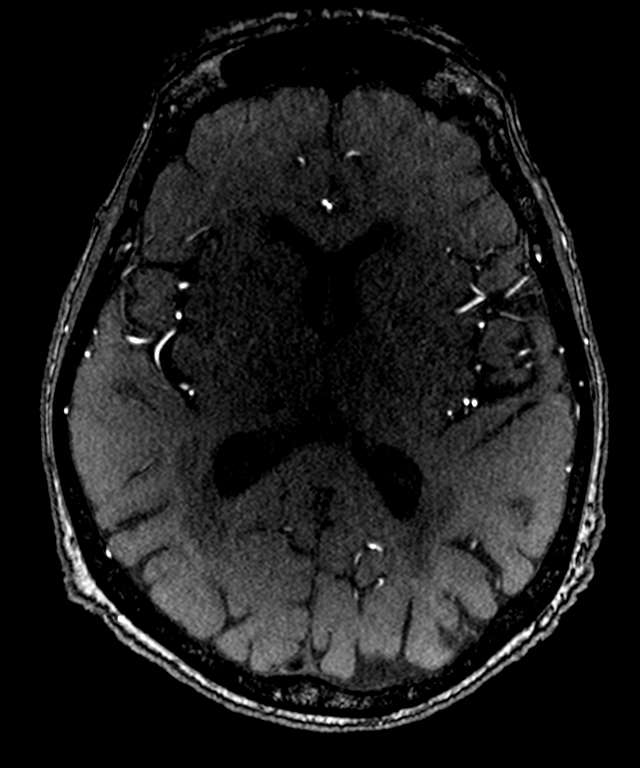
[im 126/149]
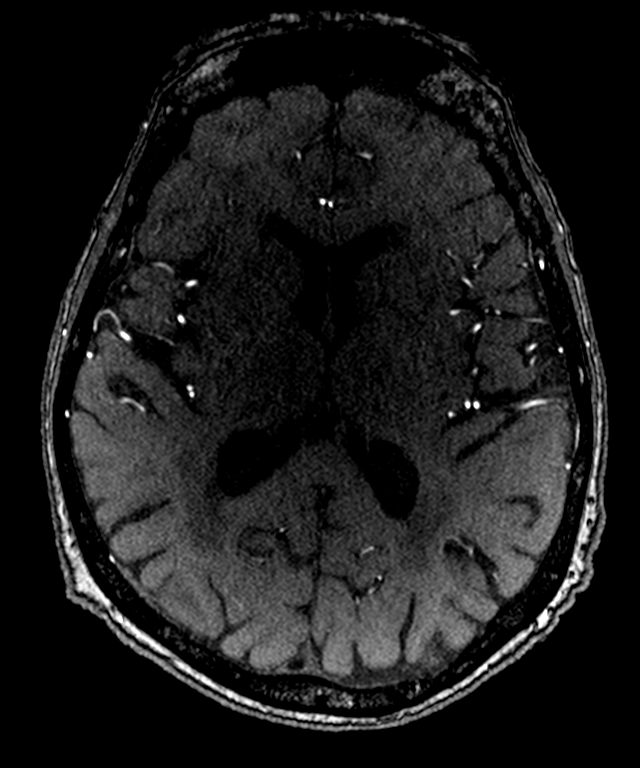

[10 of 48 positions shown; findings below may reference images not displayed]

FINDINGS: Anterior circulation: Visualized distal cervical segments of the
internal carotid arteries are patent with antegrade flow. Petrous
segments widely patent bilaterally. Scattered atheromatous
irregularity throughout the carotid siphons, right slightly worse
than left. No hemodynamically significant stenosis about the left
siphon. On the right, associated stenosis of up to approximate 50%
about the proximal cavernous right ICA (series 8, image 82).
Fusiform aneurysm involving the cavernous left ICA measures
approximately 10 x 9 x 8 mm (series 8, image 86). Finding accounts
for the abnormality seen on prior MRI. There is an additional 5 mm
saccular aneurysm extending posteriorly from the contralateral
cavernous right ICA (series 8, image 79).

A1 segments patent bilaterally. Normal anterior communicating artery
complex. Anterior cerebral arteries patent without stenosis. No M1
stenosis or occlusion. Normal MCA bifurcations. Distal MCA branches
well perfused and fairly symmetric.

Posterior circulation: Both vertebral arteries patent to the
vertebrobasilar junction without hemodynamically significant
stenosis. Both PICA origins grossly patent and normal. Basilar
patent to its distal aspect without stenosis. Superior cerebellar
arteries patent bilaterally. Right PCA supplied primarily via the
basilar. Fetal type origin of the left PCA. PCAs patent to their
distal aspects without stenosis.

Anatomic variants: Fetal type origin of the left PCA.

Other: None.
IMPRESSION: 1. 10 x 9 x 8 mm fusiform aneurysm involving the cavernous left ICA,
corresponding with the abnormality from previous brain MRI.
2. Additional 5 mm saccular aneurysm extending posteriorly from the
contralateral cavernous right ICA.
3. Intracranial atherosclerotic disease, most pronounced about the
carotid siphons where there is associated 50% stenosis about the
cavernous right ICA. No other proximal high-grade or correctable
stenosis about the intracranial circulation.

## 2023-10-27 DIAGNOSIS — Z7189 Other specified counseling: Secondary | ICD-10-CM | POA: Diagnosis not present

## 2023-10-27 DIAGNOSIS — E039 Hypothyroidism, unspecified: Secondary | ICD-10-CM | POA: Diagnosis not present

## 2023-10-27 DIAGNOSIS — R918 Other nonspecific abnormal finding of lung field: Secondary | ICD-10-CM | POA: Diagnosis not present

## 2023-10-27 DIAGNOSIS — I10 Essential (primary) hypertension: Secondary | ICD-10-CM | POA: Diagnosis not present

## 2023-10-27 DIAGNOSIS — R0781 Pleurodynia: Secondary | ICD-10-CM | POA: Diagnosis not present

## 2023-10-27 DIAGNOSIS — Z1331 Encounter for screening for depression: Secondary | ICD-10-CM | POA: Diagnosis not present

## 2023-10-27 DIAGNOSIS — Z681 Body mass index (BMI) 19 or less, adult: Secondary | ICD-10-CM | POA: Diagnosis not present

## 2023-10-27 DIAGNOSIS — M810 Age-related osteoporosis without current pathological fracture: Secondary | ICD-10-CM | POA: Diagnosis not present

## 2023-10-27 DIAGNOSIS — I2510 Atherosclerotic heart disease of native coronary artery without angina pectoris: Secondary | ICD-10-CM | POA: Diagnosis not present

## 2023-10-27 DIAGNOSIS — Z0000 Encounter for general adult medical examination without abnormal findings: Secondary | ICD-10-CM | POA: Diagnosis not present

## 2023-10-27 DIAGNOSIS — L989 Disorder of the skin and subcutaneous tissue, unspecified: Secondary | ICD-10-CM | POA: Diagnosis not present

## 2023-10-27 DIAGNOSIS — E785 Hyperlipidemia, unspecified: Secondary | ICD-10-CM | POA: Diagnosis not present

## 2023-10-27 DIAGNOSIS — R413 Other amnesia: Secondary | ICD-10-CM | POA: Diagnosis not present

## 2023-11-24 DIAGNOSIS — R911 Solitary pulmonary nodule: Secondary | ICD-10-CM | POA: Diagnosis not present

## 2023-11-25 DIAGNOSIS — R918 Other nonspecific abnormal finding of lung field: Secondary | ICD-10-CM | POA: Diagnosis not present

## 2024-03-31 DIAGNOSIS — R911 Solitary pulmonary nodule: Secondary | ICD-10-CM | POA: Diagnosis not present

## 2024-03-31 DIAGNOSIS — J439 Emphysema, unspecified: Secondary | ICD-10-CM | POA: Diagnosis not present
